# Patient Record
Sex: Female | Born: 1970 | Race: White | Hispanic: No | Marital: Married | Smoking: Never smoker
Health system: Southern US, Community
[De-identification: ages and names within clinical notes are randomized; demographics above are authoritative.]

## PROBLEM LIST (undated history)

## (undated) DIAGNOSIS — F419 Anxiety disorder, unspecified: Secondary | ICD-10-CM

## (undated) DIAGNOSIS — T7840XA Allergy, unspecified, initial encounter: Secondary | ICD-10-CM

## (undated) HISTORY — DX: Anxiety disorder, unspecified: F41.9

## (undated) HISTORY — DX: Allergy, unspecified, initial encounter: T78.40XA

---

## 1997-07-31 ENCOUNTER — Other Ambulatory Visit: Admission: RE | Admit: 1997-07-31 | Discharge: 1997-07-31 | Payer: Self-pay | Admitting: Internal Medicine

## 1998-07-05 ENCOUNTER — Other Ambulatory Visit: Admission: RE | Admit: 1998-07-05 | Discharge: 1998-07-05 | Payer: Self-pay | Admitting: Obstetrics and Gynecology

## 1999-09-12 ENCOUNTER — Other Ambulatory Visit: Admission: RE | Admit: 1999-09-12 | Discharge: 1999-09-12 | Payer: Self-pay | Admitting: Internal Medicine

## 2000-02-20 ENCOUNTER — Emergency Department (HOSPITAL_COMMUNITY): Admission: EM | Admit: 2000-02-20 | Discharge: 2000-02-20 | Payer: Self-pay | Admitting: Emergency Medicine

## 2001-11-08 ENCOUNTER — Other Ambulatory Visit: Admission: RE | Admit: 2001-11-08 | Discharge: 2001-11-08 | Payer: Self-pay | Admitting: Obstetrics and Gynecology

## 2003-02-11 ENCOUNTER — Other Ambulatory Visit: Admission: RE | Admit: 2003-02-11 | Discharge: 2003-02-11 | Payer: Self-pay | Admitting: Obstetrics and Gynecology

## 2007-07-01 ENCOUNTER — Emergency Department (HOSPITAL_COMMUNITY): Admission: EM | Admit: 2007-07-01 | Discharge: 2007-07-01 | Payer: Self-pay | Admitting: Emergency Medicine

## 2011-03-29 ENCOUNTER — Other Ambulatory Visit: Payer: Self-pay | Admitting: Internal Medicine

## 2011-03-29 DIAGNOSIS — Z1231 Encounter for screening mammogram for malignant neoplasm of breast: Secondary | ICD-10-CM

## 2011-04-13 ENCOUNTER — Ambulatory Visit
Admission: RE | Admit: 2011-04-13 | Discharge: 2011-04-13 | Disposition: A | Discharge status: Home / Self Care | Payer: BC Managed Care – PPO | Source: Ambulatory Visit | Attending: Internal Medicine | Admitting: Internal Medicine

## 2011-04-13 DIAGNOSIS — Z1231 Encounter for screening mammogram for malignant neoplasm of breast: Secondary | ICD-10-CM

## 2011-04-19 ENCOUNTER — Other Ambulatory Visit: Payer: Self-pay | Admitting: Internal Medicine

## 2011-04-19 DIAGNOSIS — R928 Other abnormal and inconclusive findings on diagnostic imaging of breast: Secondary | ICD-10-CM

## 2011-04-26 ENCOUNTER — Ambulatory Visit
Admission: RE | Admit: 2011-04-26 | Discharge: 2011-04-26 | Disposition: A | Discharge status: Home / Self Care | Payer: BC Managed Care – PPO | Source: Ambulatory Visit | Attending: Internal Medicine | Admitting: Internal Medicine

## 2011-04-26 DIAGNOSIS — R928 Other abnormal and inconclusive findings on diagnostic imaging of breast: Secondary | ICD-10-CM

## 2012-03-28 ENCOUNTER — Other Ambulatory Visit: Payer: Self-pay | Admitting: Internal Medicine

## 2012-03-28 DIAGNOSIS — Z1231 Encounter for screening mammogram for malignant neoplasm of breast: Secondary | ICD-10-CM

## 2012-04-23 ENCOUNTER — Ambulatory Visit
Admission: RE | Admit: 2012-04-23 | Discharge: 2012-04-23 | Disposition: A | Discharge status: Home / Self Care | Payer: BC Managed Care – PPO | Source: Ambulatory Visit | Attending: Internal Medicine | Admitting: Internal Medicine

## 2012-04-23 DIAGNOSIS — Z1231 Encounter for screening mammogram for malignant neoplasm of breast: Secondary | ICD-10-CM

## 2012-04-24 ENCOUNTER — Ambulatory Visit: Payer: BC Managed Care – PPO

## 2012-04-24 IMAGING — MG MM DIGITAL SCREENING BILAT
8 series · 9 of 24 positions shown · non-contrast
Comparison: Previous exams.

CLINICAL DATA: Screening.

DIGITAL BILATERAL SCREENING MAMMOGRAM WITH CAD
DIGITAL BREAST TOMOSYNTHESIS
Digital breast tomosynthesis images are acquired in two
projections.  These images are reviewed in combination with the
digital mammogram, confirming the findings below.

[L MLO]
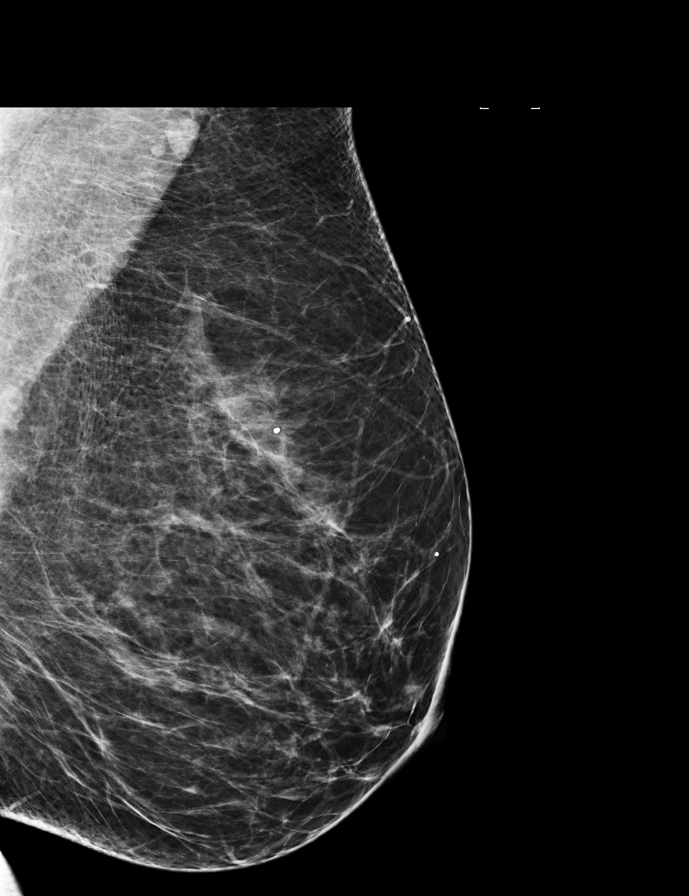

[L CC]
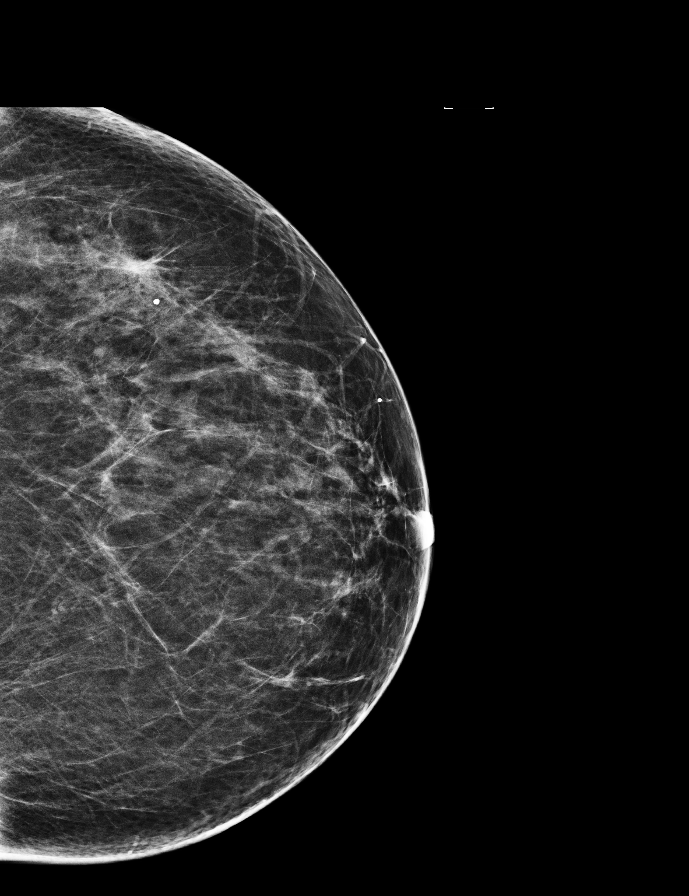

[R MLO]
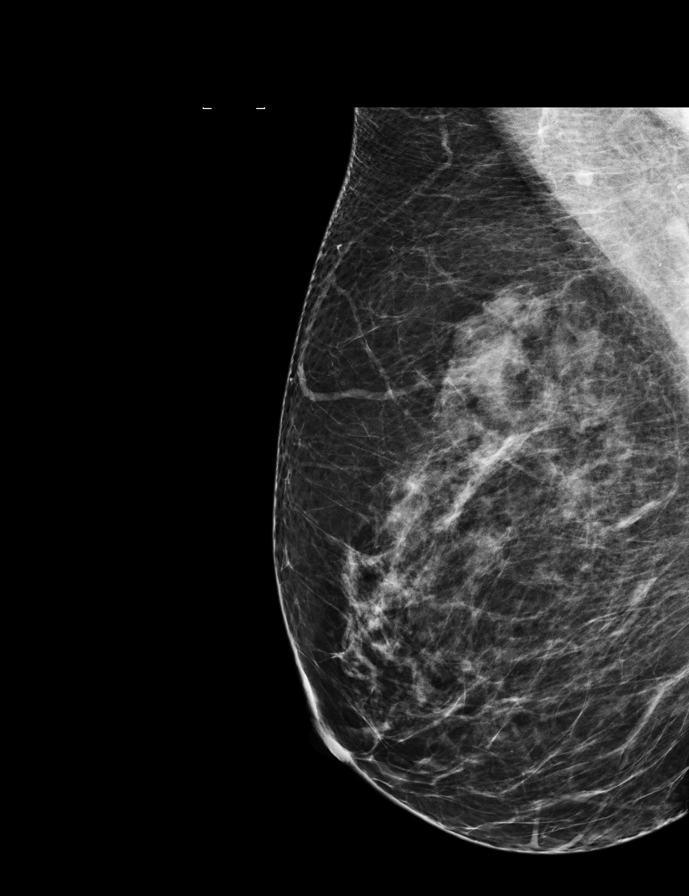

[R CC]
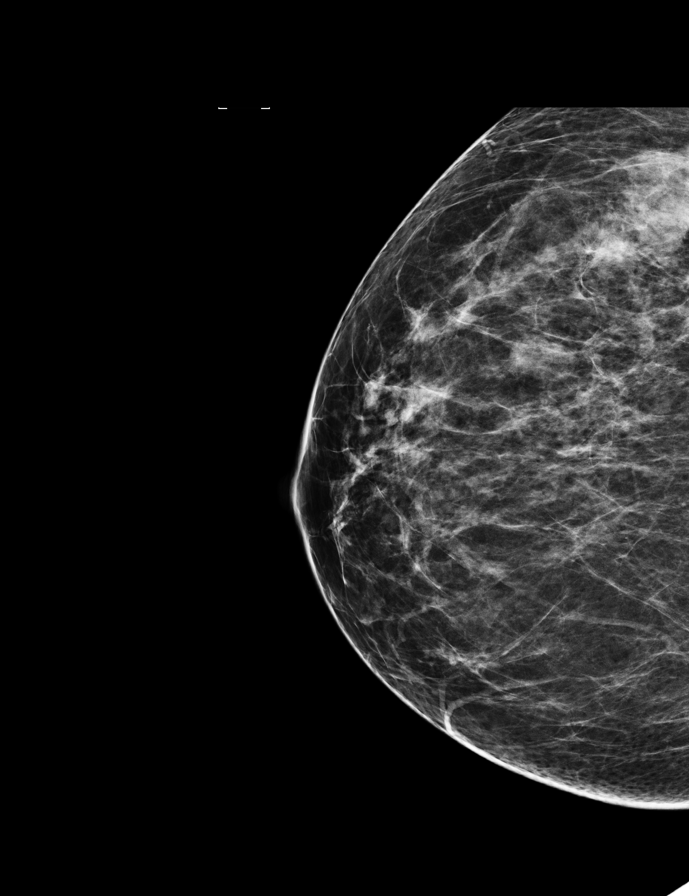

[L MLO tomo · 2 of 84 frames shown]
[frame 28/84]
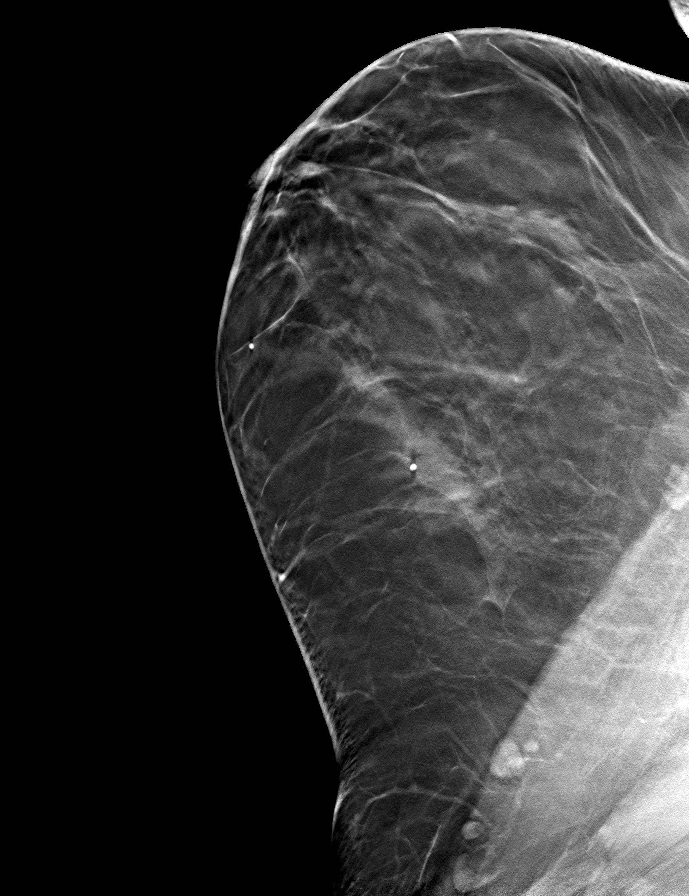
[frame 43/84]
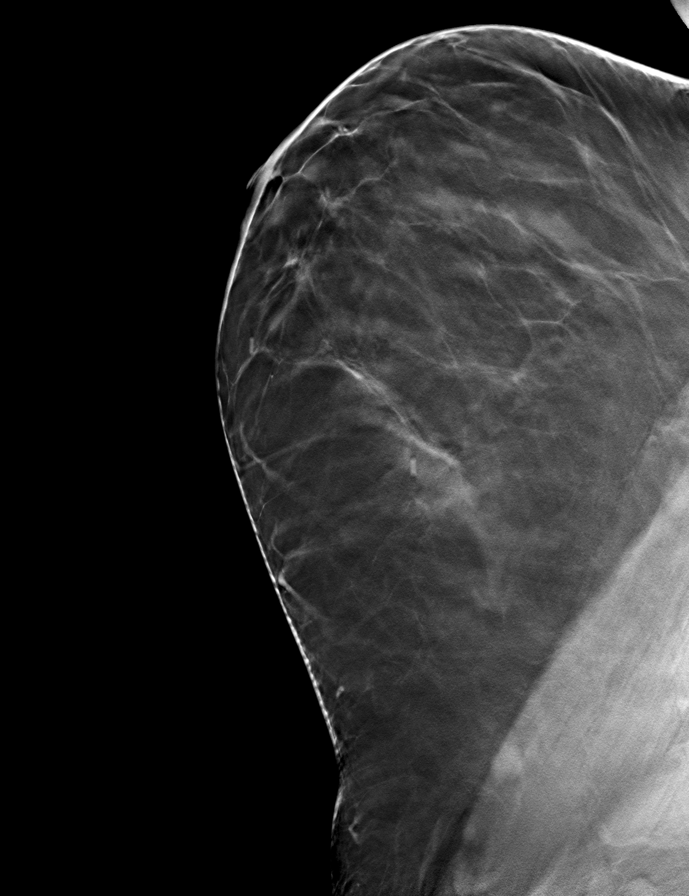

[R MLO tomo · tomo slice 42/83.0]
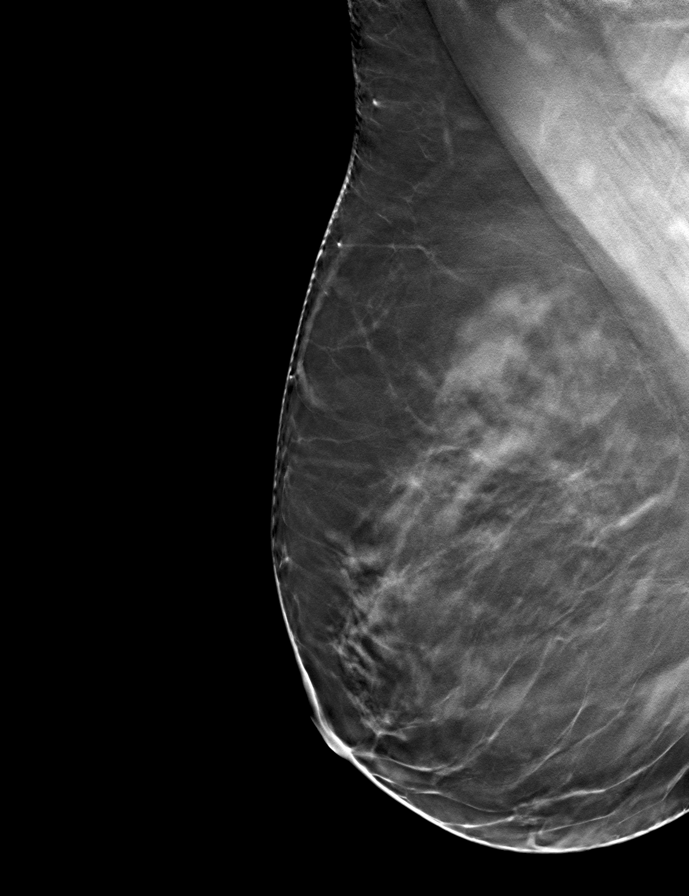

[R CC tomo · tomo slice 37/73.0]
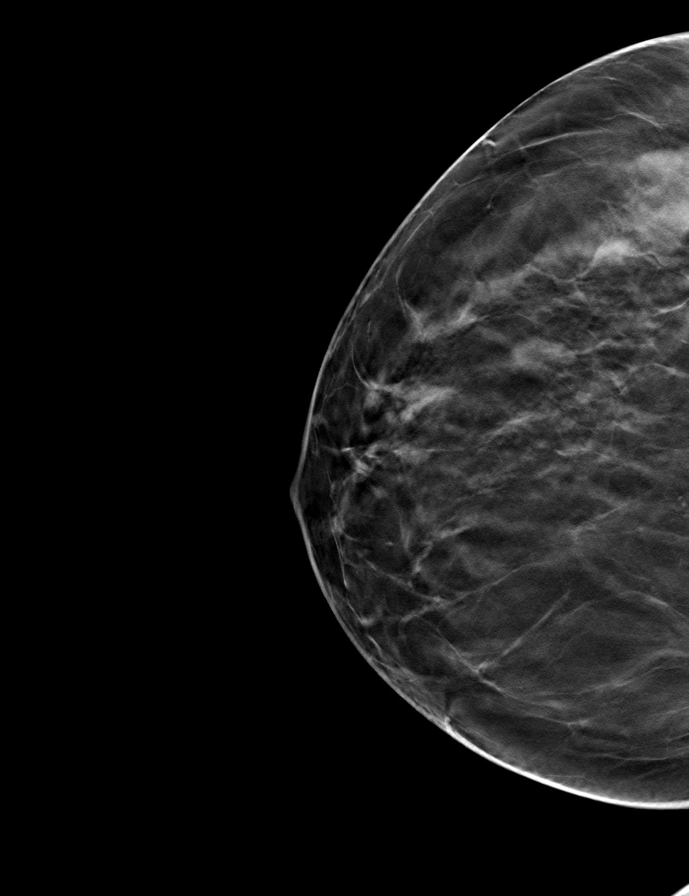

[L CC tomo · tomo slice 40/79.0]
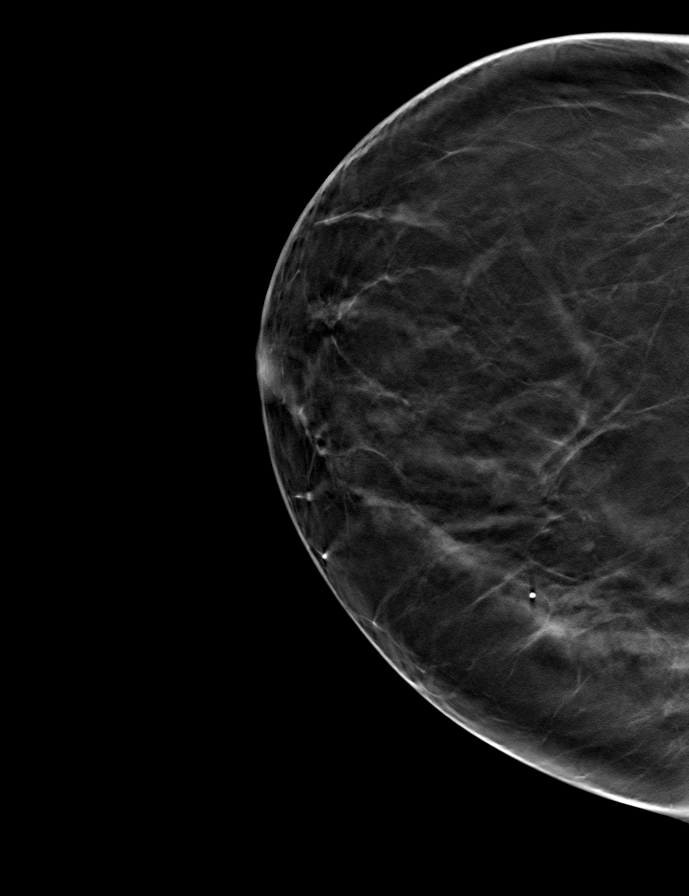

[9 of 24 positions shown; findings below may reference images not displayed]

FINDINGS: ACR Breast Density Category 2: There is a scattered fibroglandular
pattern.

No suspicious masses, architectural distortion, or calcifications
are present.

Images were processed with CAD.
IMPRESSION: No mammographic evidence of malignancy.

A result letter of this screening mammogram will be mailed directly
to the patient.

RECOMMENDATION:
Screening mammogram in one year. (Code:[OK])

BI-RADS CATEGORY 1:  Negative.

## 2013-05-21 ENCOUNTER — Other Ambulatory Visit: Payer: Self-pay

## 2013-05-21 DIAGNOSIS — Z1231 Encounter for screening mammogram for malignant neoplasm of breast: Secondary | ICD-10-CM

## 2013-06-12 ENCOUNTER — Ambulatory Visit
Admission: RE | Admit: 2013-06-12 | Discharge: 2013-06-12 | Disposition: A | Discharge status: Home / Self Care | Payer: BC Managed Care – PPO | Source: Ambulatory Visit

## 2013-06-12 DIAGNOSIS — Z1231 Encounter for screening mammogram for malignant neoplasm of breast: Secondary | ICD-10-CM

## 2014-06-22 ENCOUNTER — Other Ambulatory Visit: Payer: Self-pay

## 2014-06-22 DIAGNOSIS — Z1231 Encounter for screening mammogram for malignant neoplasm of breast: Secondary | ICD-10-CM

## 2014-07-01 ENCOUNTER — Ambulatory Visit
Admission: RE | Admit: 2014-07-01 | Discharge: 2014-07-01 | Disposition: A | Discharge status: Home / Self Care | Payer: BC Managed Care – PPO | Source: Ambulatory Visit

## 2014-07-01 DIAGNOSIS — Z1231 Encounter for screening mammogram for malignant neoplasm of breast: Secondary | ICD-10-CM

## 2015-07-12 ENCOUNTER — Ambulatory Visit
Admission: RE | Admit: 2015-07-12 | Discharge: 2015-07-12 | Disposition: A | Discharge status: Home / Self Care | Payer: BC Managed Care – PPO | Source: Ambulatory Visit

## 2015-07-12 ENCOUNTER — Other Ambulatory Visit: Payer: Self-pay

## 2015-07-12 DIAGNOSIS — Z1231 Encounter for screening mammogram for malignant neoplasm of breast: Secondary | ICD-10-CM

## 2015-10-25 ENCOUNTER — Other Ambulatory Visit: Payer: Self-pay | Admitting: Family Medicine

## 2015-10-25 DIAGNOSIS — R1013 Epigastric pain: Secondary | ICD-10-CM

## 2015-10-26 ENCOUNTER — Ambulatory Visit
Admission: RE | Admit: 2015-10-26 | Discharge: 2015-10-26 | Disposition: A | Discharge status: Home / Self Care | Payer: BC Managed Care – PPO | Source: Ambulatory Visit | Attending: Family Medicine | Admitting: Family Medicine

## 2015-10-26 ENCOUNTER — Other Ambulatory Visit: Payer: Self-pay | Admitting: Family Medicine

## 2015-10-26 DIAGNOSIS — R1013 Epigastric pain: Secondary | ICD-10-CM

## 2016-08-14 ENCOUNTER — Other Ambulatory Visit: Payer: Self-pay | Admitting: Internal Medicine

## 2016-08-14 DIAGNOSIS — Z1231 Encounter for screening mammogram for malignant neoplasm of breast: Secondary | ICD-10-CM

## 2016-09-12 ENCOUNTER — Ambulatory Visit
Admission: RE | Admit: 2016-09-12 | Discharge: 2016-09-12 | Disposition: A | Discharge status: Home / Self Care | Payer: BC Managed Care – PPO | Source: Ambulatory Visit | Attending: Internal Medicine | Admitting: Internal Medicine

## 2016-09-12 DIAGNOSIS — Z1231 Encounter for screening mammogram for malignant neoplasm of breast: Secondary | ICD-10-CM

## 2017-09-19 ENCOUNTER — Other Ambulatory Visit: Payer: Self-pay | Admitting: Internal Medicine

## 2017-09-19 DIAGNOSIS — Z1231 Encounter for screening mammogram for malignant neoplasm of breast: Secondary | ICD-10-CM

## 2017-10-11 ENCOUNTER — Ambulatory Visit
Admission: RE | Admit: 2017-10-11 | Discharge: 2017-10-11 | Disposition: A | Discharge status: Home / Self Care | Payer: BC Managed Care – PPO | Source: Ambulatory Visit | Attending: Internal Medicine | Admitting: Internal Medicine

## 2017-10-11 DIAGNOSIS — Z1231 Encounter for screening mammogram for malignant neoplasm of breast: Secondary | ICD-10-CM

## 2017-10-12 ENCOUNTER — Other Ambulatory Visit: Payer: Self-pay | Admitting: Internal Medicine

## 2017-10-12 DIAGNOSIS — R928 Other abnormal and inconclusive findings on diagnostic imaging of breast: Secondary | ICD-10-CM

## 2017-10-16 ENCOUNTER — Ambulatory Visit
Admission: RE | Admit: 2017-10-16 | Discharge: 2017-10-16 | Disposition: A | Discharge status: Home / Self Care | Payer: BC Managed Care – PPO | Source: Ambulatory Visit | Attending: Internal Medicine | Admitting: Internal Medicine

## 2017-10-16 ENCOUNTER — Other Ambulatory Visit: Payer: Self-pay | Admitting: Internal Medicine

## 2017-10-16 DIAGNOSIS — R928 Other abnormal and inconclusive findings on diagnostic imaging of breast: Secondary | ICD-10-CM

## 2017-10-22 ENCOUNTER — Other Ambulatory Visit: Payer: Self-pay | Admitting: Internal Medicine

## 2017-10-22 DIAGNOSIS — N6002 Solitary cyst of left breast: Secondary | ICD-10-CM

## 2018-04-22 ENCOUNTER — Ambulatory Visit
Admission: RE | Admit: 2018-04-22 | Discharge: 2018-04-22 | Disposition: A | Discharge status: Home / Self Care | Payer: BC Managed Care – PPO | Source: Ambulatory Visit | Attending: Internal Medicine | Admitting: Internal Medicine

## 2018-04-22 ENCOUNTER — Other Ambulatory Visit: Payer: Self-pay | Admitting: Internal Medicine

## 2018-04-22 DIAGNOSIS — N6002 Solitary cyst of left breast: Secondary | ICD-10-CM

## 2018-04-23 ENCOUNTER — Other Ambulatory Visit: Payer: BC Managed Care – PPO

## 2018-10-14 ENCOUNTER — Ambulatory Visit
Admission: RE | Admit: 2018-10-14 | Discharge: 2018-10-14 | Disposition: A | Discharge status: Home / Self Care | Payer: BC Managed Care – PPO | Source: Ambulatory Visit | Attending: Internal Medicine | Admitting: Internal Medicine

## 2018-10-14 ENCOUNTER — Other Ambulatory Visit: Payer: Self-pay

## 2018-10-14 DIAGNOSIS — N6002 Solitary cyst of left breast: Secondary | ICD-10-CM

## 2019-09-30 ENCOUNTER — Other Ambulatory Visit: Payer: Self-pay | Admitting: Internal Medicine

## 2019-09-30 DIAGNOSIS — N6320 Unspecified lump in the left breast, unspecified quadrant: Secondary | ICD-10-CM

## 2019-10-20 ENCOUNTER — Ambulatory Visit
Admission: RE | Admit: 2019-10-20 | Discharge: 2019-10-20 | Disposition: A | Discharge status: Home / Self Care | Payer: BC Managed Care – PPO | Source: Ambulatory Visit | Attending: Internal Medicine | Admitting: Internal Medicine

## 2019-10-20 ENCOUNTER — Other Ambulatory Visit: Payer: Self-pay

## 2019-10-20 DIAGNOSIS — N6320 Unspecified lump in the left breast, unspecified quadrant: Secondary | ICD-10-CM

## 2019-12-04 ENCOUNTER — Encounter: Payer: Self-pay | Admitting: Internal Medicine

## 2020-02-06 ENCOUNTER — Other Ambulatory Visit: Payer: Self-pay

## 2020-02-06 ENCOUNTER — Ambulatory Visit (AMBULATORY_SURGERY_CENTER): Payer: Self-pay | Admitting: *Deleted

## 2020-02-06 VITALS — Ht 64.0 in | Wt 166.0 lb

## 2020-02-06 DIAGNOSIS — Z1211 Encounter for screening for malignant neoplasm of colon: Secondary | ICD-10-CM

## 2020-02-06 MED ORDER — NA SULFATE-K SULFATE-MG SULF 17.5-3.13-1.6 GM/177ML PO SOLN: ORAL | 0 refills | Status: DC

## 2020-02-06 NOTE — Progress Notes (Signed)
Patient is here in-person for PV. Patient denies any allergies to eggs or soy. Patient denies any problems with anesthesia/sedation. Patient denies any oxygen use at home. Patient denies taking any diet/weight loss medications or blood thinners. Patient is not being treated for MRSA or C-diff. Patient is aware of our care-partner policy and Covid-19 safety protocol.    COVID-19 vaccines completed on 01/24/20, per patient.   Prep Prescription coupon given to the patient.

## 2020-02-09 ENCOUNTER — Encounter: Payer: Self-pay | Admitting: Internal Medicine

## 2020-02-20 ENCOUNTER — Other Ambulatory Visit: Payer: Self-pay

## 2020-02-20 ENCOUNTER — Encounter: Payer: Self-pay | Admitting: Internal Medicine

## 2020-02-20 ENCOUNTER — Ambulatory Visit (AMBULATORY_SURGERY_CENTER): Payer: BC Managed Care – PPO | Admitting: Internal Medicine

## 2020-02-20 VITALS — BP 123/75 | HR 68 | Temp 97.3°F | Resp 14 | Ht 64.0 in | Wt 166.0 lb

## 2020-02-20 DIAGNOSIS — Z1211 Encounter for screening for malignant neoplasm of colon: Secondary | ICD-10-CM

## 2020-02-20 MED ORDER — SODIUM CHLORIDE 0.9 % IV SOLN: 500.0000 mL | Freq: Once | INTRAVENOUS | Status: DC

## 2020-02-20 NOTE — Progress Notes (Signed)
A/ox3, pleased with MAC, report to RN 

## 2020-02-20 NOTE — Patient Instructions (Addendum)
No polyps or cancer were seen.  A few diverticula or diverticulosis - not a problem usually.  Next routine colonoscopy or other screening test in 10 years - 2031 You do not need to do stool tests in between if offered - that is overdoing it so if PCP orders that you should decline.  I appreciate the opportunity to care for you. Iva Boop, MD, [facility]   Handout given for diverticulosis  YOU HAD AN ENDOSCOPIC PROCEDURE TODAY AT THE [facility]:   Refer to the procedure report that was given to you for any specific questions about what was found during the examination.  If the procedure report does not answer your questions, please call your gastroenterologist to clarify.  If you requested that your care partner not be given the details of your procedure findings, then the procedure report has been included in a sealed envelope for you to review at your convenience later.  YOU SHOULD EXPECT: Some feelings of bloating in the abdomen. Passage of more gas than usual.  Walking can help get rid of the air that was put into your GI tract during the procedure and reduce the bloating. If you had a lower endoscopy (such as a colonoscopy or flexible sigmoidoscopy) you may notice spotting of blood in your stool or on the toilet paper. If you underwent a bowel prep for your procedure, you may not have a normal bowel movement for a few days.  Please Note:  You might notice some irritation and congestion in your nose or some drainage.  This is from the oxygen used during your procedure.  There is no need for concern and it should clear up in a day or so.  SYMPTOMS TO REPORT IMMEDIATELY:   Following lower endoscopy (colonoscopy or flexible sigmoidoscopy):  Excessive amounts of blood in the stool  Significant tenderness or worsening of abdominal pains  Swelling of the abdomen that is new, acute  Fever of 100F or higher  For urgent or emergent issues, a gastroenterologist can be reached at  any hour by calling [phone]. Do not use MyChart messaging for urgent concerns.    DIET:  We do recommend a small meal at first, but then you may proceed to your regular diet.  Drink plenty of fluids but you should avoid alcoholic beverages for 24 hours.  ACTIVITY:  You should plan to take it easy for the rest of today and you should NOT DRIVE or use heavy machinery until tomorrow (because of the sedation medicines used during the test).    FOLLOW UP: Our staff will call the number listed on your records 48-72 hours following your procedure to check on you and address any questions or concerns that you may have regarding the information given to you following your procedure. If we do not reach you, we will leave a message.  We will attempt to reach you two times.  During this call, we will ask if you have developed any symptoms of COVID 19. If you develop any symptoms (ie: fever, flu-like symptoms, shortness of breath, cough etc.) before then, please call [phone].  If you test positive for Covid 19 in the 2 weeks post procedure, please call and report this information to [place].    If any biopsies were taken you will be contacted by phone or by letter within the next 1-3 weeks.  Please call us at [phone] if you have not heard about the biopsies in 3 weeks.  SIGNATURES/CONFIDENTIALITY: You and/or your care partner have signed paperwork which will be entered into your electronic medical record.  These signatures attest to the fact that that the information above on your After Visit Summary has been reviewed and is understood.  Full responsibility of the confidentiality of this discharge information lies with you and/or your care-partner.

## 2020-02-20 NOTE — Op Note (Signed)
[facility] Patient Name: Anna Downs Procedure Date: 02/20/2020 7:31 AM MRN: [id number] Endoscopist: Iva Boop , MD Age: 49 Referring MD:  Date of Birth: 01-28-1971 Gender: Female Account #: [id number] Procedure:                Colonoscopy Indications:              Screening for colorectal malignant neoplasm Medicines:                Propofol per Anesthesia, Monitored Anesthesia Care Procedure:                Pre-Anesthesia Assessment:                           - Prior to the procedure, a History and Physical                            was performed, and patient medications and                            allergies were reviewed. The patient's tolerance of                            previous anesthesia was also reviewed. The risks                            and benefits of the procedure and the sedation                            options and risks were discussed with the patient.                            All questions were answered, and informed consent                            was obtained. Prior Anticoagulants: The patient has                            taken no previous anticoagulant or antiplatelet                            agents. ASA Grade Assessment: II - A patient with                            mild systemic disease. After reviewing the risks                            and benefits, the patient was deemed in                            satisfactory condition to undergo the procedure.                           After obtaining informed consent, the colonoscope  was passed under direct vision. Throughout the                            procedure, the patient's blood pressure, pulse, and                            oxygen saturations were monitored continuously. The                            Colonoscope was introduced through the anus and                            advanced to the the cecum, identified by                             appendiceal orifice and ileocecal valve. The                            colonoscopy was performed without difficulty. The                            patient tolerated the procedure well. The quality                            of the bowel preparation was excellent. The bowel                            preparation used was SUPREP via split dose                            instruction. The ileocecal valve, appendiceal                            orifice, and rectum were photographed. Scope In: 8:18:31 AM Scope Out: 8:34:23 AM Scope Withdrawal Time: 0 hours 11 minutes 55 seconds  Total Procedure Duration: 0 hours 15 minutes 52 seconds  Findings:                 The perianal and digital rectal examinations were                            normal.                           A few diverticula were found in the sigmoid colon.                           The exam was otherwise without abnormality on                            direct and retroflexion views. Complications:            No immediate complications. Estimated Blood Loss:     Estimated blood loss was minimal. Impression:               - Diverticulosis in  the sigmoid colon.                           - The examination was otherwise normal on direct                            and retroflexion views.                           - No specimens collected. Recommendation:           - Patient has a contact number available for                            emergencies. The signs and symptoms of potential                            delayed complications were discussed with the                            patient. Return to normal activities tomorrow.                            Written discharge instructions were provided to the                            patient.                           - Resume previous diet.                           - Continue present medications.                           - Repeat colonoscopy in 10 years for screening                             purposes. Iva Boop, MD 02/20/2020 8:46:37 AM This report has been signed electronically.

## 2020-02-20 NOTE — Progress Notes (Signed)
NS vitals.  Pt's states no medical or surgical changes since previsit or office visit.

## 2020-02-23 ENCOUNTER — Telehealth: Payer: Self-pay

## 2020-02-23 NOTE — Telephone Encounter (Signed)
NO ANSWER, MESSAGE LEFT FOR PATIENT. 

## 2020-02-23 NOTE — Telephone Encounter (Signed)
  Follow up Call-  Call back number 02/20/2020  Post procedure Call Back phone  # [phone]  Permission to leave phone message Yes  Some recent data might be hidden     2nd follow up call made.  NALM

## 2020-10-07 ENCOUNTER — Other Ambulatory Visit: Payer: Self-pay | Admitting: Internal Medicine

## 2020-10-07 DIAGNOSIS — Z1231 Encounter for screening mammogram for malignant neoplasm of breast: Secondary | ICD-10-CM

## 2020-12-02 ENCOUNTER — Other Ambulatory Visit: Payer: Self-pay

## 2020-12-02 ENCOUNTER — Ambulatory Visit
Admission: RE | Admit: 2020-12-02 | Discharge: 2020-12-02 | Disposition: A | Discharge status: Home / Self Care | Payer: BC Managed Care – PPO | Source: Ambulatory Visit | Attending: Internal Medicine | Admitting: Internal Medicine

## 2020-12-02 DIAGNOSIS — Z1231 Encounter for screening mammogram for malignant neoplasm of breast: Secondary | ICD-10-CM

## 2020-12-07 ENCOUNTER — Other Ambulatory Visit: Payer: Self-pay | Admitting: Internal Medicine

## 2020-12-07 DIAGNOSIS — R928 Other abnormal and inconclusive findings on diagnostic imaging of breast: Secondary | ICD-10-CM

## 2020-12-17 ENCOUNTER — Other Ambulatory Visit: Payer: Self-pay | Admitting: Internal Medicine

## 2020-12-17 ENCOUNTER — Ambulatory Visit
Admission: RE | Admit: 2020-12-17 | Discharge: 2020-12-17 | Disposition: A | Discharge status: Home / Self Care | Payer: BC Managed Care – PPO | Source: Ambulatory Visit | Attending: Internal Medicine | Admitting: Internal Medicine

## 2020-12-17 ENCOUNTER — Other Ambulatory Visit: Payer: Self-pay

## 2020-12-17 DIAGNOSIS — R928 Other abnormal and inconclusive findings on diagnostic imaging of breast: Secondary | ICD-10-CM

## 2021-06-17 ENCOUNTER — Other Ambulatory Visit: Payer: Self-pay

## 2021-06-17 ENCOUNTER — Other Ambulatory Visit: Payer: Self-pay | Admitting: Internal Medicine

## 2021-06-17 ENCOUNTER — Ambulatory Visit
Admission: RE | Admit: 2021-06-17 | Discharge: 2021-06-17 | Disposition: A | Discharge status: Home / Self Care | Payer: BC Managed Care – PPO | Source: Ambulatory Visit | Attending: Internal Medicine | Admitting: Internal Medicine

## 2021-06-17 DIAGNOSIS — R928 Other abnormal and inconclusive findings on diagnostic imaging of breast: Secondary | ICD-10-CM

## 2021-06-17 DIAGNOSIS — R921 Mammographic calcification found on diagnostic imaging of breast: Secondary | ICD-10-CM

## 2021-12-21 ENCOUNTER — Ambulatory Visit
Admission: RE | Admit: 2021-12-21 | Discharge: 2021-12-21 | Disposition: A | Discharge status: Home / Self Care | Payer: BC Managed Care – PPO | Source: Ambulatory Visit | Attending: Internal Medicine | Admitting: Internal Medicine

## 2021-12-21 DIAGNOSIS — R921 Mammographic calcification found on diagnostic imaging of breast: Secondary | ICD-10-CM

## 2022-12-15 ENCOUNTER — Other Ambulatory Visit: Payer: Self-pay | Admitting: Internal Medicine

## 2022-12-15 DIAGNOSIS — R921 Mammographic calcification found on diagnostic imaging of breast: Secondary | ICD-10-CM

## 2022-12-28 ENCOUNTER — Ambulatory Visit
Admission: RE | Admit: 2022-12-28 | Discharge: 2022-12-28 | Disposition: A | Discharge status: Home / Self Care | Payer: BC Managed Care – PPO | Source: Ambulatory Visit | Attending: Internal Medicine | Admitting: Internal Medicine

## 2022-12-28 DIAGNOSIS — R921 Mammographic calcification found on diagnostic imaging of breast: Secondary | ICD-10-CM

## 2023-12-17 ENCOUNTER — Other Ambulatory Visit: Payer: Self-pay | Admitting: Hematology & Oncology

## 2023-12-17 DIAGNOSIS — Z0000 Encounter for general adult medical examination without abnormal findings: Secondary | ICD-10-CM

## 2024-01-14 ENCOUNTER — Ambulatory Visit
Admission: RE | Admit: 2024-01-14 | Discharge: 2024-01-14 | Disposition: A | Discharge status: Home / Self Care | Payer: Self-pay | Source: Ambulatory Visit | Attending: Hematology & Oncology | Admitting: Hematology & Oncology

## 2024-01-14 DIAGNOSIS — Z0000 Encounter for general adult medical examination without abnormal findings: Secondary | ICD-10-CM
